# Patient Record
Sex: Female | Born: 1960 | Race: White | Hispanic: No | Marital: Married | Smoking: Never smoker
Health system: Southern US, Community
[De-identification: ages and names within clinical notes are randomized; demographics above are authoritative.]

## PROBLEM LIST (undated history)

## (undated) DIAGNOSIS — K219 Gastro-esophageal reflux disease without esophagitis: Secondary | ICD-10-CM

## (undated) DIAGNOSIS — C50919 Malignant neoplasm of unspecified site of unspecified female breast: Secondary | ICD-10-CM

## (undated) HISTORY — DX: Malignant neoplasm of unspecified site of unspecified female breast: C50.919

## (undated) HISTORY — DX: Gastro-esophageal reflux disease without esophagitis: K21.9

---

## 1998-09-11 DIAGNOSIS — C50919 Malignant neoplasm of unspecified site of unspecified female breast: Secondary | ICD-10-CM

## 1998-09-11 HISTORY — DX: Malignant neoplasm of unspecified site of unspecified female breast: C50.919

## 1999-12-01 ENCOUNTER — Encounter: Admission: RE | Admit: 1999-12-01 | Discharge: 2000-02-29 | Payer: Self-pay | Admitting: *Deleted

## 2001-01-21 ENCOUNTER — Encounter: Admission: RE | Admit: 2001-01-21 | Discharge: 2001-01-21 | Payer: Self-pay | Admitting: Oncology

## 2001-01-21 ENCOUNTER — Encounter (HOSPITAL_COMMUNITY): Admission: RE | Admit: 2001-01-21 | Discharge: 2001-02-20 | Payer: Self-pay | Admitting: Oncology

## 2001-07-31 ENCOUNTER — Encounter (HOSPITAL_COMMUNITY): Admission: RE | Admit: 2001-07-31 | Discharge: 2001-08-30 | Payer: Self-pay | Admitting: Oncology

## 2001-07-31 ENCOUNTER — Encounter: Admission: RE | Admit: 2001-07-31 | Discharge: 2001-07-31 | Payer: Self-pay | Admitting: Oncology

## 2005-09-06 ENCOUNTER — Ambulatory Visit (HOSPITAL_COMMUNITY): Admission: RE | Admit: 2005-09-06 | Discharge: 2005-09-06 | Payer: Self-pay | Admitting: Family Medicine

## 2014-08-26 ENCOUNTER — Encounter: Payer: Self-pay | Admitting: *Deleted

## 2016-02-22 ENCOUNTER — Encounter: Payer: Self-pay | Admitting: Nurse Practitioner

## 2016-02-22 ENCOUNTER — Ambulatory Visit (INDEPENDENT_AMBULATORY_CARE_PROVIDER_SITE_OTHER): Payer: BLUE CROSS/BLUE SHIELD | Admitting: Nurse Practitioner

## 2016-02-22 ENCOUNTER — Other Ambulatory Visit (HOSPITAL_COMMUNITY): Payer: Self-pay | Admitting: Internal Medicine

## 2016-02-22 VITALS — BP 110/74 | Ht 62.0 in | Wt 116.4 lb

## 2016-02-22 DIAGNOSIS — Z853 Personal history of malignant neoplasm of breast: Secondary | ICD-10-CM

## 2016-02-22 DIAGNOSIS — Z113 Encounter for screening for infections with a predominantly sexual mode of transmission: Secondary | ICD-10-CM | POA: Diagnosis not present

## 2016-02-22 DIAGNOSIS — Z124 Encounter for screening for malignant neoplasm of cervix: Secondary | ICD-10-CM

## 2016-02-22 DIAGNOSIS — Z780 Asymptomatic menopausal state: Secondary | ICD-10-CM

## 2016-02-22 DIAGNOSIS — Z1151 Encounter for screening for human papillomavirus (HPV): Secondary | ICD-10-CM | POA: Diagnosis not present

## 2016-02-22 DIAGNOSIS — Z0000 Encounter for general adult medical examination without abnormal findings: Secondary | ICD-10-CM

## 2016-02-22 DIAGNOSIS — F411 Generalized anxiety disorder: Secondary | ICD-10-CM

## 2016-02-22 DIAGNOSIS — Z01419 Encounter for gynecological examination (general) (routine) without abnormal findings: Secondary | ICD-10-CM

## 2016-02-22 DIAGNOSIS — F419 Anxiety disorder, unspecified: Secondary | ICD-10-CM | POA: Diagnosis not present

## 2016-02-22 DIAGNOSIS — F430 Acute stress reaction: Secondary | ICD-10-CM

## 2016-02-23 ENCOUNTER — Encounter: Payer: Self-pay | Admitting: Nurse Practitioner

## 2016-02-23 DIAGNOSIS — Z780 Asymptomatic menopausal state: Secondary | ICD-10-CM | POA: Insufficient documentation

## 2016-02-23 MED ORDER — CLONAZEPAM 0.5 MG PO TABS: 0.5000 mg | ORAL_TABLET | Freq: Every day | ORAL | Status: AC

## 2016-02-23 NOTE — Progress Notes (Addendum)
   Subjective:    Patient ID: Sheila Vasquez, female    DOB: 07/12/1961, 55 y.o.   MRN: [id number]  HPI presents for her wellness exam. Had cancer in the left breast 2001. Lumpectomy and radiation. No recent mammogram. 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 No menses x 5 years. Married, same sexual partner. Healthy diet. Very active. Regular vision and dental exams.     Review of Systems  Constitutional: Negative for activity change, appetite change and fatigue.  HENT: Negative for dental problem, ear pain, sinus pressure and sore throat.   Respiratory: Negative for cough, chest tightness, shortness of breath and wheezing.   Cardiovascular: Negative for chest pain.  Gastrointestinal: Negative for nausea, vomiting, abdominal pain, diarrhea, constipation and abdominal distention.  Genitourinary: Negative for dysuria, urgency, frequency, vaginal bleeding, vaginal discharge, enuresis, difficulty urinating, genital sores and pelvic pain.  Psychiatric/Behavioral: Negative for suicidal ideas and sleep disturbance. The patient is not nervous/anxious.        Objective:   Physical Exam  Constitutional: She is oriented to person, place, and time. She appears well-developed. No distress.  HENT:  Right Ear: External ear normal.  Left Ear: External ear normal.  Mouth/Throat: Oropharynx is clear and moist.  Neck: Normal range of motion. Neck supple. No tracheal deviation present. No thyromegaly present.  Cardiovascular: Normal rate, regular rhythm and normal heart sounds.  Exam reveals no gallop.   No murmur heard. Pulmonary/Chest: Effort normal and breath sounds normal.  Abdominal: Soft. She exhibits no distension. There is no tenderness.  Genitourinary: Vagina normal and uterus normal. No vaginal discharge found.  External GU: no rashes or lesions. Vagina: no discharge. Cervix normal in appearance. No CMT. Bimanual exam: no tenderness  or obvious masses.  Musculoskeletal: She exhibits no edema.  Lymphadenopathy:    She has no cervical adenopathy.  Neurological: She is alert and oriented to person, place, and time.  Skin: Skin is warm and dry. No rash noted.  Sun damage noted.  Psychiatric: She has a normal mood and affect. Her behavior is normal.  Vitals reviewed. Breast exam: no masses; scar tissue noted left breast; axillae no adenopathy.         Assessment & Plan:  Well woman exam - Plan: Pap IG and HPV (high risk) DNA detection, Basic metabolic panel, CBC with Differential/Platelet, Lipid panel, Hepatic function panel, Basic metabolic panel, TSH, VITAMIN D 25 Hydroxy (Vit-D Deficiency, Fractures), Hepatitis C Antibody  Screening for cervical cancer - Plan: Pap IG and HPV (high risk) DNA detection  Screening for HPV (human papillomavirus) - Plan: Pap IG and HPV (high risk) DNA detection  Screening for STD (sexually transmitted disease) - Plan: HIV antibody (with reflex)  HX: breast cancer - Plan: MM DIAG BREAST TOMO BILATERAL, US BREAST LTD UNI LEFT INC AXILLA, US BREAST LTD UNI RIGHT INC AXILLA, CANCELED: MM DIGITAL SCREENING BILATERAL, CANCELED: MM Digital Diagnostic Bilat, CANCELED: US BREAST COMPLETE UNI RIGHT INC AXILLA, CANCELED: US BREAST COMPLETE UNI LEFT INC AXILLA  Post-menopausal - Plan: DG Bone Density  Given information on colonoscopy; strongly encouraged before insurance runs out next May. Recommend daily vitamin D and calcium. Given information on local dermatologist; encouraged skin cancer screening.  Return in about 1 year (around 02/21/2017) for physical.

## 2016-02-24 ENCOUNTER — Other Ambulatory Visit: Payer: Self-pay | Admitting: *Deleted

## 2016-02-24 DIAGNOSIS — Z1231 Encounter for screening mammogram for malignant neoplasm of breast: Secondary | ICD-10-CM

## 2016-02-24 LAB — PAP IG AND HPV HIGH-RISK
HPV, high-risk: NEGATIVE
PAP Smear Comment: 0

## 2016-03-07 ENCOUNTER — Encounter (HOSPITAL_COMMUNITY): Payer: Self-pay

## 2016-03-07 ENCOUNTER — Other Ambulatory Visit (HOSPITAL_COMMUNITY): Payer: Self-pay

## 2016-03-21 ENCOUNTER — Encounter (HOSPITAL_COMMUNITY): Payer: Self-pay

## 2016-03-21 ENCOUNTER — Encounter: Payer: Self-pay | Admitting: Nurse Practitioner

## 2016-03-21 ENCOUNTER — Ambulatory Visit (HOSPITAL_COMMUNITY)
Admission: RE | Admit: 2016-03-21 | Discharge: 2016-03-21 | Disposition: A | Discharge status: Home / Self Care | Payer: BLUE CROSS/BLUE SHIELD | Source: Ambulatory Visit | Attending: Nurse Practitioner | Admitting: Nurse Practitioner

## 2016-03-21 DIAGNOSIS — Z780 Asymptomatic menopausal state: Secondary | ICD-10-CM | POA: Diagnosis present

## 2016-03-21 DIAGNOSIS — M810 Age-related osteoporosis without current pathological fracture: Secondary | ICD-10-CM | POA: Insufficient documentation

## 2016-03-27 ENCOUNTER — Other Ambulatory Visit: Payer: Self-pay | Admitting: Nurse Practitioner

## 2016-03-27 ENCOUNTER — Ambulatory Visit (HOSPITAL_COMMUNITY)
Admission: RE | Admit: 2016-03-27 | Discharge: 2016-03-27 | Disposition: A | Discharge status: Home / Self Care | Payer: BLUE CROSS/BLUE SHIELD | Source: Ambulatory Visit | Attending: Nurse Practitioner | Admitting: Nurse Practitioner

## 2016-03-27 ENCOUNTER — Ambulatory Visit (HOSPITAL_COMMUNITY): Payer: BLUE CROSS/BLUE SHIELD

## 2016-03-27 DIAGNOSIS — Z1231 Encounter for screening mammogram for malignant neoplasm of breast: Secondary | ICD-10-CM | POA: Insufficient documentation

## 2016-03-28 ENCOUNTER — Other Ambulatory Visit: Payer: Self-pay | Admitting: Nurse Practitioner

## 2016-03-28 MED ORDER — ALENDRONATE SODIUM 70 MG PO TABS: 70.0000 mg | ORAL_TABLET | ORAL | Status: DC

## 2017-01-09 ENCOUNTER — Telehealth: Payer: Self-pay | Admitting: Family Medicine

## 2017-01-09 MED ORDER — PREDNISONE 20 MG PO TABS: ORAL_TABLET | ORAL | 0 refills | Status: DC

## 2017-01-09 NOTE — Telephone Encounter (Signed)
Patient has poison oak on both arms and would like something called in.  [facility]

## 2017-01-09 NOTE — Telephone Encounter (Signed)
Spoke with patient and informed her per Dr.Scott Luking- we are sending in a prednisone taper. Patient verbalized understanding.

## 2017-01-09 NOTE — Telephone Encounter (Signed)
Typically we use prednisone tablets please verify with patient that this is what they want to do- if so: Prednisone 20 mg, 3 a day for the first 2 days then 2 per day for 3 days then 1 a day for 3 days #15

## 2017-02-01 ENCOUNTER — Telehealth: Payer: Self-pay | Admitting: Family Medicine

## 2017-02-01 MED ORDER — PREDNISONE 20 MG PO TABS: ORAL_TABLET | ORAL | 0 refills | Status: DC

## 2017-02-01 NOTE — Telephone Encounter (Signed)
Prescription sent electronically to pharmacy. Patient notified. 

## 2017-02-01 NOTE — Telephone Encounter (Signed)
Ok adult pred taper

## 2017-02-01 NOTE — Telephone Encounter (Signed)
Pt called requesting prednisone to be called in for poison oak.     [facility]

## 2017-02-07 ENCOUNTER — Ambulatory Visit (INDEPENDENT_AMBULATORY_CARE_PROVIDER_SITE_OTHER): Payer: BLUE CROSS/BLUE SHIELD | Admitting: Family Medicine

## 2017-02-07 ENCOUNTER — Encounter: Payer: Self-pay | Admitting: Family Medicine

## 2017-02-07 VITALS — BP 108/72 | Ht 62.0 in | Wt 122.0 lb

## 2017-02-07 DIAGNOSIS — R5383 Other fatigue: Secondary | ICD-10-CM | POA: Diagnosis not present

## 2017-02-07 DIAGNOSIS — Z13220 Encounter for screening for lipoid disorders: Secondary | ICD-10-CM | POA: Diagnosis not present

## 2017-02-07 DIAGNOSIS — Z111 Encounter for screening for respiratory tuberculosis: Secondary | ICD-10-CM | POA: Diagnosis not present

## 2017-02-07 DIAGNOSIS — M810 Age-related osteoporosis without current pathological fracture: Secondary | ICD-10-CM

## 2017-02-07 DIAGNOSIS — Z114 Encounter for screening for human immunodeficiency virus [HIV]: Secondary | ICD-10-CM

## 2017-02-07 DIAGNOSIS — Z1159 Encounter for screening for other viral diseases: Secondary | ICD-10-CM

## 2017-02-07 NOTE — Progress Notes (Signed)
   Subjective:    Patient ID: Sheila Vasquez, female    DOB: May 20, 1961, 56 y.o.   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 Pt has physicals done with carolyn.  Patient does have osteoporosis she takes her medicine on a regular basis. She gets her bone density on a regular basis this was reviewed with her we also discussed the importance of calcium and vitamin D regular physical activity Vision 20/20 bilateral.   Review of Systems Patient denies any depression and anxiety denies any chest tightness pressure pain shortness breath or back pain    Objective:   Physical Exam  Lungs clear hearts regular pulse normal BP good extremities no edema      Assessment & Plan:  Osteoporosis continue medication patient not having any problem with medicine bone density will not be for another year she will continue calcium and vitamin D in her medication  We will check lab work including vitamin D levels plus also lab work because of mouth fatigue plus also screening labs are recommended.  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

## 2017-02-09 LAB — TB SKIN TEST
Induration: 0 mm
TB Skin Test: NEGATIVE

## 2017-03-13 ENCOUNTER — Other Ambulatory Visit: Payer: Self-pay | Admitting: Nurse Practitioner

## 2017-12-20 ENCOUNTER — Telehealth: Payer: Self-pay | Admitting: Family Medicine

## 2017-12-20 ENCOUNTER — Other Ambulatory Visit: Payer: Self-pay | Admitting: Family Medicine

## 2017-12-20 MED ORDER — VALACYCLOVIR HCL 1 G PO TABS: ORAL_TABLET | ORAL | 12 refills | Status: AC

## 2017-12-20 NOTE — Telephone Encounter (Signed)
Nurses-please be aware I sent in her medication and I spoke with her on the phone

## 2017-12-20 NOTE — Telephone Encounter (Signed)
Pt is requesting a refill on Valtrex   [facility] [place] [facility]

## 2017-12-20 NOTE — Telephone Encounter (Signed)
Find out from the patient with the Valtrex is she needing this for cold sore?  Or herpes flareup?

## 2018-02-27 ENCOUNTER — Ambulatory Visit: Payer: BLUE CROSS/BLUE SHIELD | Admitting: Family Medicine

## 2018-02-27 ENCOUNTER — Encounter: Payer: Self-pay | Admitting: Family Medicine

## 2018-02-27 VITALS — BP 118/80 | Temp 98.4°F | Ht 62.0 in | Wt 128.0 lb

## 2018-02-27 DIAGNOSIS — S80862A Insect bite (nonvenomous), left lower leg, initial encounter: Secondary | ICD-10-CM

## 2018-02-27 DIAGNOSIS — T63464A Toxic effect of venom of wasps, undetermined, initial encounter: Secondary | ICD-10-CM

## 2018-02-27 NOTE — Progress Notes (Signed)
   Subjective:    Patient ID: Sheila Vasquez, female    DOB: 1960-09-17, 57 y.o.   MRN: [id number]  HPIstung by a wasp 2 days ago on right side of forehead and on side of left lower leg. Swells at night.   Couple areas one on the lower leg one on the face is puffy she is worried about allergic reaction worried about the possibility of infection this was discussed in detail this is more of a localized reaction not a sign of any severe reaction may use Benadryl as needed should gradually get better  Review of Systems Denies difficulty breathing denies difficulty swallowing denies chest tightness pressure pain shortness of breath    Objective:   Physical Exam  Lungs clear heart regular puffiness noted underneath the right eye      Assessment & Plan:  Multiple stings on the face and lower leg causing some swelling around the face This is not a sign of any type of life-threatening problem Warning signs discussed
